# Patient Record
Sex: Male | Born: 1995 | Race: White | Hispanic: No | Marital: Single | Smoking: Former smoker
Health system: Southern US, Community
[De-identification: ages and names within clinical notes are randomized; demographics above are authoritative.]

## PROBLEM LIST (undated history)

## (undated) HISTORY — PX: TONSILLECTOMY: SUR1361

## (undated) HISTORY — PX: ADENOIDECTOMY: SUR15

## (undated) HISTORY — PX: TYMPANOSTOMY TUBE PLACEMENT: SHX32

## (undated) HISTORY — PX: OTHER SURGICAL HISTORY: SHX169

---
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
## 2013-01-24 ENCOUNTER — Emergency Department: Payer: Self-pay | Admitting: Emergency Medicine

## 2013-04-05 ENCOUNTER — Emergency Department: Payer: Self-pay | Admitting: Emergency Medicine

## 2013-09-15 ENCOUNTER — Emergency Department: Payer: Self-pay | Admitting: Internal Medicine

## 2013-09-18 LAB — BETA STREP CULTURE(ARMC)

## 2014-04-25 ENCOUNTER — Emergency Department: Payer: Self-pay | Admitting: Emergency Medicine

## 2014-05-09 ENCOUNTER — Emergency Department: Payer: Self-pay | Admitting: Emergency Medicine

## 2014-10-11 ENCOUNTER — Emergency Department: Payer: Self-pay | Admitting: Emergency Medicine

## 2017-05-07 ENCOUNTER — Encounter: Payer: Self-pay | Admitting: Emergency Medicine

## 2017-05-07 ENCOUNTER — Emergency Department: Payer: Self-pay

## 2017-05-07 ENCOUNTER — Emergency Department
Admission: EM | Admit: 2017-05-07 | Discharge: 2017-05-07 | Disposition: A | Discharge status: Home / Self Care | Payer: Self-pay | Attending: Emergency Medicine | Admitting: Emergency Medicine

## 2017-05-07 DIAGNOSIS — J069 Acute upper respiratory infection, unspecified: Secondary | ICD-10-CM | POA: Insufficient documentation

## 2017-05-07 DIAGNOSIS — F17210 Nicotine dependence, cigarettes, uncomplicated: Secondary | ICD-10-CM | POA: Insufficient documentation

## 2017-05-07 MED ORDER — FLUTICASONE PROPIONATE 50 MCG/ACT NA SUSP: 2.0000 | Freq: Every day | NASAL | 0 refills | Status: DC

## 2017-05-07 MED ORDER — DOXYCYCLINE HYCLATE 50 MG PO CAPS: 100.0000 mg | ORAL_CAPSULE | Freq: Two times a day (BID) | ORAL | 0 refills | Status: AC

## 2017-05-07 MED ORDER — ALBUTEROL SULFATE HFA 108 (90 BASE) MCG/ACT IN AERS: 2.0000 | INHALATION_SPRAY | Freq: Four times a day (QID) | RESPIRATORY_TRACT | 0 refills | Status: DC | PRN

## 2017-05-07 NOTE — ED Provider Notes (Signed)
[facility] Emergency Department Provider Note  ____________________________________________  Time seen: Approximately 9:10 AM  I have reviewed the triage vital signs and the nursing notes.   HISTORY  Chief Complaint URI    HPI Andre Graham is a 21 y.o. male that presents to the emergency department with congestion and productive cough for 7 days. He is coughing up brown sputum. He is eating and drinking normally. He smokes a pack of cigarettes a day. He has pneumonia previously. No history of allergies or asthma. No fevers, chills, SOB, nausea, vomiting, abdominal pain, diarrhea, constipation.    History reviewed. No pertinent past medical history.  There are no active problems to display for this patient.   Past Surgical History:  Procedure Laterality Date  . craniofacial     surgeries    Prior to Admission medications   Medication Sig Start Date End Date Taking? Authorizing Provider  albuterol (PROVENTIL HFA;VENTOLIN HFA) 108 (90 Base) MCG/ACT inhaler Inhale 2 puffs into the lungs every 6 (six) hours as needed for wheezing or shortness of breath. 05/07/17   Enid DerryWagner, Jeury Mcnab, PA-C  doxycycline (VIBRAMYCIN) 50 MG capsule Take 2 capsules (100 mg total) by mouth 2 (two) times daily. 05/07/17 05/17/17  Enid DerryWagner, Lucine Bilski, PA-C  fluticasone (FLONASE) 50 MCG/ACT nasal spray Place 2 sprays into both nostrils daily. 05/07/17 05/07/18  Enid DerryWagner, Jacelynn Hayton, PA-C    Allergies Patient has no known allergies.  No family history on file.  Social History Social History  Substance Use Topics  . Smoking status: Current Every Day Smoker    Types: Cigarettes  . Smokeless tobacco: Never Used  . Alcohol use Not on file     Review of Systems  Constitutional: No fever/chills Eyes: No visual changes. No discharge. ENT: Positive for congestion and rhinorrhea. Respiratory: Positive for cough. No SOB. Gastrointestinal: No abdominal pain.  No nausea, no vomiting.  No diarrhea.  No  constipation. Musculoskeletal: Negative for musculoskeletal pain. Skin: Negative for rash, abrasions, lacerations, ecchymosis. Neurological: Negative for headaches.   ____________________________________________   PHYSICAL EXAM:  VITAL SIGNS: ED Triage Vitals  Enc Vitals Group     BP 05/07/17 0825 116/78     Pulse Rate 05/07/17 0825 78     Resp 05/07/17 0825 18     Temp 05/07/17 0825 98.9 F (37.2 C)     Temp Source 05/07/17 0825 Oral     SpO2 05/07/17 0825 97 %     Weight 05/07/17 0819 245 lb (111.1 kg)     Height 05/07/17 0819 5\' 9"  (1.753 m)     Head Circumference --      Peak Flow --      Pain Score --      Pain Loc --      Pain Edu? --      Excl. in GC? --      Constitutional: Alert and oriented. Well appearing and in no acute distress. Eyes: Conjunctivae are normal. PERRL. EOMI. No discharge. Head: Atraumatic. ENT: No frontal and maxillary sinus tenderness.      Ears: Tympanic membranes pearly gray with good landmarks. No discharge.      Nose: Mild congestion/rhinnorhea.      Mouth/Throat: Mucous membranes are moist. Oropharynx non-erythematous. Tonsils not enlarged. No exudates. Uvula midline. Neck: No stridor.   Hematological/Lymphatic/Immunilogical: No cervical lymphadenopathy. Cardiovascular: Normal rate, regular rhythm.  Good peripheral circulation. Respiratory: Normal respiratory effort without tachypnea or retractions. Lungs CTAB. Good air entry to the bases with no decreased or  absent breath sounds. Gastrointestinal: Bowel sounds 4 quadrants. Soft and nontender to palpation. No guarding or rigidity. No palpable masses. No distention. Musculoskeletal: Full range of motion to all extremities. No gross deformities appreciated. Neurologic:  Normal speech and language. No gross focal neurologic deficits are appreciated.  Skin:  Skin is warm, dry and intact. No rash noted.   ____________________________________________   LABS (all labs ordered are listed,  but only abnormal results are displayed)  Labs Reviewed - No data to display ____________________________________________  EKG   ____________________________________________  RADIOLOGY Lexine BatonI, Micaiah Litle, personally viewed and evaluated these images (plain radiographs) as part of my medical decision making, as well as reviewing the written report by the radiologist.  Dg Chest 2 View  Result Date: 05/07/2017 CLINICAL DATA:  Productive cough for 7 days. EXAM: CHEST  2 VIEW COMPARISON:  None available FINDINGS: Normal heart size and mediastinal contours. No acute infiltrate or edema. No effusion or pneumothorax. No acute osseous findings. IMPRESSION: Negative chest. Electronically Signed   By: Marnee SpringJonathon  Watts M.D.   On: 05/07/2017 09:37    ____________________________________________    PROCEDURES  Procedure(s) performed:    Procedures    Medications - No data to display   ____________________________________________   INITIAL IMPRESSION / ASSESSMENT AND PLAN / ED COURSE  Pertinent labs & imaging results that were available during my care of the patient were reviewed by me and considered in my medical decision making (see chart for details).  Review of the [place] CSRS was performed in accordance of the [facility] prior to dispensing any controlled drugs.  Patient's diagnosis is consistent with upper respiratory infection. Vital signs and exam are reassuring. Chest x-ray negative for acute cardiopulmonary processes. Patient appears well and is staying well hydrated. Patient feels comfortable going home. Patient will be discharged home with prescriptions for doxycycline, albuterol, Flonase. Patient is to follow up with PCP as needed or otherwise directed. Patient is given ED precautions to return to the ED for any worsening or new symptoms.     ____________________________________________  FINAL CLINICAL IMPRESSION(S) / ED DIAGNOSES  Final diagnoses:  Upper respiratory tract  infection, unspecified type      NEW MEDICATIONS STARTED DURING THIS VISIT:  Discharge Medication List as of 05/07/2017 10:08 AM    START taking these medications   Details  albuterol (PROVENTIL HFA;VENTOLIN HFA) 108 (90 Base) MCG/ACT inhaler Inhale 2 puffs into the lungs every 6 (six) hours as needed for wheezing or shortness of breath., Starting Thu 05/07/2017, Print    doxycycline (VIBRAMYCIN) 50 MG capsule Take 2 capsules (100 mg total) by mouth 2 (two) times daily., Starting Thu 05/07/2017, Until Sun 05/17/2017, Print    fluticasone (FLONASE) 50 MCG/ACT nasal spray Place 2 sprays into both nostrils daily., Starting Thu 05/07/2017, Until Fri 05/07/2018, Print            This chart was dictated using voice recognition software/Dragon. Despite best efforts to proofread, errors can occur which can change the meaning. Any change was purely unintentional.    Enid DerryWagner, Rage Beever, PA-C 05/07/17 1040    Arnaldo NatalMalinda, Paul F, MD 05/07/17 [phone]

## 2017-05-07 NOTE — ED Notes (Signed)
Presents with several days of cough,which has been productive a times  States he is coughing up some dark phlegm  Unsure of fever but is afebrile on arrival  Also having some discomfort in chest with inspiration

## 2017-08-20 ENCOUNTER — Encounter: Payer: Self-pay | Admitting: Emergency Medicine

## 2017-08-20 ENCOUNTER — Other Ambulatory Visit: Payer: Self-pay

## 2017-08-20 ENCOUNTER — Emergency Department
Admission: EM | Admit: 2017-08-20 | Discharge: 2017-08-20 | Disposition: A | Discharge status: Home / Self Care | Payer: Self-pay | Attending: Emergency Medicine | Admitting: Emergency Medicine

## 2017-08-20 DIAGNOSIS — M5431 Sciatica, right side: Secondary | ICD-10-CM | POA: Insufficient documentation

## 2017-08-20 DIAGNOSIS — F17210 Nicotine dependence, cigarettes, uncomplicated: Secondary | ICD-10-CM | POA: Insufficient documentation

## 2017-08-20 LAB — GLUCOSE, CAPILLARY: GLUCOSE-CAPILLARY: 103 mg/dL — AB (ref 65–99)

## 2017-08-20 MED ORDER — LIDOCAINE 5 % EX PTCH
1.0000 | MEDICATED_PATCH | CUTANEOUS | Status: DC
  Administered 2017-08-20: 1 via TRANSDERMAL
  Filled 2017-08-20: qty 1

## 2017-08-20 MED ORDER — CYCLOBENZAPRINE HCL 10 MG PO TABS: 10.0000 mg | ORAL_TABLET | Freq: Three times a day (TID) | ORAL | 0 refills | Status: DC | PRN

## 2017-08-20 MED ORDER — CYCLOBENZAPRINE HCL 10 MG PO TABS
10.0000 mg | ORAL_TABLET | Freq: Once | ORAL | Status: AC
  Administered 2017-08-20: 10 mg via ORAL
  Filled 2017-08-20: qty 1

## 2017-08-20 MED ORDER — LIDOCAINE 5 % EX PTCH: 1.0000 | MEDICATED_PATCH | CUTANEOUS | 0 refills | Status: AC

## 2017-08-20 NOTE — ED Triage Notes (Signed)
Patient ambulatory to triage with steady gait, without difficulty or distress noted; pt c/o lower back pain radiating down right leg with no specific injury, hx of same

## 2017-08-20 NOTE — ED Notes (Signed)
Pt has low back pain radiating into right leg.  Sx for 1.5 months.  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  [place] dr in [place] dx with muscle pain.  Pt denies urinary sx.

## 2017-08-20 NOTE — ED Notes (Signed)
Pt was advised not to drive since he received a muscle relaxer. Pt states that someone is picking him up at the ED.

## 2017-08-20 NOTE — ED Provider Notes (Signed)
[facility] Emergency Department Provider Note   First MD Initiated Contact with Patient 08/20/17 [phone]     (approximate)  I have reviewed the triage vital signs and the nursing notes.   HISTORY  Chief Complaint Back Pain   HPI Andre Graham is a 21 y.o. male presents to the emergency department with 1-10/1739-month history of low back pain with radiation down the right posterior leg.  Patient states his current pain score 7 out of 10 worse with movement.  Patient denies any urinary or bowel habit changes.  Patient denies any lower extremity weakness.   Past medical history Back pain There are no active problems to display for this patient.   Past Surgical History:  Procedure Laterality Date  . ADENOIDECTOMY    . craniofacial     surgeries  . TONSILLECTOMY    . TYMPANOSTOMY TUBE PLACEMENT      Prior to Admission medications   Medication Sig Start Date End Date Taking? Authorizing Provider  albuterol (PROVENTIL HFA;VENTOLIN HFA) 108 (90 Base) MCG/ACT inhaler Inhale 2 puffs into the lungs every 6 (six) hours as needed for wheezing or shortness of breath. 05/07/17   Enid DerryWagner, Ashley, PA-C  fluticasone (FLONASE) 50 MCG/ACT nasal spray Place 2 sprays into both nostrils daily. 05/07/17 05/07/18  Enid DerryWagner, Ashley, PA-C    Allergies No known drug allergies No family history on file.  Social History Social History   Tobacco Use  . Smoking status: Current Every Day Smoker    Types: Cigarettes  . Smokeless tobacco: Never Used  Substance Use Topics  . Alcohol use: No    Frequency: Never  . Drug use: Not on file    Review of Systems Constitutional: No fever/chills Eyes: No visual changes. ENT: No sore throat. Cardiovascular: Denies chest pain. Respiratory: Denies shortness of breath. Gastrointestinal: No abdominal pain.  No nausea, no vomiting.  No diarrhea.  No constipation. Genitourinary: Negative for dysuria. Musculoskeletal: Negative for neck pain.   Positive for back pain. Integumentary: Negative for rash. Neurological: Negative for headaches, focal weakness or numbness.   ____________________________________________   PHYSICAL EXAM:  VITAL SIGNS: ED Triage Vitals  Enc Vitals Group     BP 08/20/17 0210 (!) 146/92     Pulse Rate 08/20/17 0210 91     Resp 08/20/17 0210 18     Temp 08/20/17 0210 97.6 F (36.4 C)     Temp Source 08/20/17 0210 Oral     SpO2 08/20/17 0210 99 %     Weight 08/20/17 0207 117.9 kg (260 lb)     Height 08/20/17 0207 1.753 m (5\' 9" )     Head Circumference --      Peak Flow --      Pain Score 08/20/17 0207 7     Pain Loc --      Pain Edu? --      Excl. in GC? --     Constitutional: Alert and oriented. Well appearing and in no acute distress. Eyes: Conjunctivae are normal. Head: Atraumatic. Mouth/Throat: Mucous membranes are moist.  Oropharynx non-erythematous. Neck: No stridor.   Cardiovascular: Normal rate, regular rhythm. Good peripheral circulation. Grossly normal heart sounds. Respiratory: Normal respiratory effort.  No retractions. Lungs CTAB. Gastrointestinal: Soft and nontender. No distention.  Musculoskeletal: No lower extremity tenderness nor edema. No gross deformities of extremities.  Pain with right paraspinal muscle palpation Neurologic:  Normal speech and language. No gross focal neurologic deficits are appreciated.  Skin:  Skin is warm,  dry and intact. No rash noted. Psychiatric: Mood and affect are normal. Speech and behavior are normal.  ____________________________________________   LABS (all labs ordered are listed, but only abnormal results are displayed)  Labs Reviewed  GLUCOSE, CAPILLARY - Abnormal; Notable for the following components:      Result Value   Glucose-Capillary 103 (*)    All other components within normal limits      Procedures   ____________________________________________   INITIAL IMPRESSION / ASSESSMENT AND PLAN / ED COURSE  As part of my  medical decision making, I reviewed the following data within the electronic MEDICAL RECORD 79 year old male presented with above-stated history and physical exam consistent with sciatica.  Consider the possibility of cauda equina syndrome however I suspect this to be unlikely given no supporting symptoms for that diagnosis.  Patient will be referred to Dr. [place]cell [place] neurosurgeon on call for further outpatient evaluation and management.    ____________________________________________  FINAL CLINICAL IMPRESSION(S) / ED DIAGNOSES  Final diagnoses:  Sciatica of right side     MEDICATIONS GIVEN DURING THIS VISIT:  Medications  lidocaine (LIDODERM) 5 % 1 patch (1 patch Transdermal Patch Applied 08/20/17 0310)  cyclobenzaprine (FLEXERIL) tablet 10 mg (10 mg Oral Given 08/20/17 0309)     ED Discharge Orders    None       Note:  This document was prepared using Dragon voice recognition software and may include unintentional dictation errors.    Darci CurrentBrown, Dunn N, MD 08/20/17 [phone]

## 2018-08-02 ENCOUNTER — Other Ambulatory Visit: Payer: Self-pay

## 2018-08-02 ENCOUNTER — Encounter: Payer: Self-pay | Admitting: Emergency Medicine

## 2018-08-02 ENCOUNTER — Emergency Department
Admission: EM | Admit: 2018-08-02 | Discharge: 2018-08-02 | Disposition: A | Discharge status: Home / Self Care | Payer: Self-pay | Attending: Emergency Medicine | Admitting: Emergency Medicine

## 2018-08-02 DIAGNOSIS — X509XXA Other and unspecified overexertion or strenuous movements or postures, initial encounter: Secondary | ICD-10-CM | POA: Insufficient documentation

## 2018-08-02 DIAGNOSIS — Y929 Unspecified place or not applicable: Secondary | ICD-10-CM | POA: Insufficient documentation

## 2018-08-02 DIAGNOSIS — Y9389 Activity, other specified: Secondary | ICD-10-CM | POA: Insufficient documentation

## 2018-08-02 DIAGNOSIS — Y998 Other external cause status: Secondary | ICD-10-CM | POA: Insufficient documentation

## 2018-08-02 DIAGNOSIS — F17210 Nicotine dependence, cigarettes, uncomplicated: Secondary | ICD-10-CM | POA: Insufficient documentation

## 2018-08-02 DIAGNOSIS — S39012A Strain of muscle, fascia and tendon of lower back, initial encounter: Secondary | ICD-10-CM | POA: Insufficient documentation

## 2018-08-02 MED ORDER — IBUPROFEN 600 MG PO TABS: 600.0000 mg | ORAL_TABLET | Freq: Four times a day (QID) | ORAL | 0 refills | Status: DC | PRN

## 2018-08-02 MED ORDER — CYCLOBENZAPRINE HCL 5 MG PO TABS: ORAL_TABLET | ORAL | 0 refills | Status: DC

## 2018-08-02 MED ORDER — LIDOCAINE 5 % EX PTCH: 1.0000 | MEDICATED_PATCH | CUTANEOUS | 0 refills | Status: DC

## 2018-08-02 NOTE — ED Provider Notes (Signed)
[facility] Emergency Department Provider Note  ____________________________________________  Time seen: Approximately 8:40 AM  I have reviewed the triage vital signs and the nursing notes.   HISTORY  Chief Complaint Back Pain    HPI Andre Graham is a 22 y.o. male that presents emergency department for evaluation of low left-sided back pain for 2 days.  Patient works changing oils of cars.  He does not do a lot of lifting but he does a lot of bending and ups and downs.  He has to jump into a 3 foot hole every day.  No recent fall or trauma.  Pain started after doing a lot of bending yesterday.  He has had sciatica before.  He took Tylenol yesterday for pain.  No nausea, vomiting, abdominal pain, radicular pain, bowel or bladder symptoms, or saddle anesthesias.  History reviewed. No pertinent past medical history.  There are no active problems to display for this patient.   Past Surgical History:  Procedure Laterality Date  . ADENOIDECTOMY    . craniofacial     surgeries  . TONSILLECTOMY    . TYMPANOSTOMY TUBE PLACEMENT      Prior to Admission medications   Medication Sig Start Date End Date Taking? Authorizing Provider  albuterol (PROVENTIL HFA;VENTOLIN HFA) 108 (90 Base) MCG/ACT inhaler Inhale 2 puffs into the lungs every 6 (six) hours as needed for wheezing or shortness of breath. 05/07/17   Enid Derry, PA-C  cyclobenzaprine (FLEXERIL) 5 MG tablet Take 1-2 tablets 3 times daily as needed 08/02/18   Enid Derry, PA-C  fluticasone [facility]) 50 MCG/ACT nasal spray Place 2 sprays into both nostrils daily. 05/07/17 05/07/18  Enid Derry, PA-C  ibuprofen (ADVIL,MOTRIN) 600 MG tablet Take 1 tablet (600 mg total) by mouth every 6 (six) hours as needed. 08/02/18   Enid Derry, PA-C  lidocaine (LIDODERM) 5 % Place 1 patch onto the skin daily. Remove & Discard patch within 12 hours or as directed by MD 08/02/18   Enid Derry, PA-C     Allergies Patient has no known allergies.  No family history on file.  Social History Social History   Tobacco Use  . Smoking status: Current Every Day Smoker    Types: Cigarettes  . Smokeless tobacco: Never Used  Substance Use Topics  . Alcohol use: No    Frequency: Never  . Drug use: Not on file     Review of Systems  Constitutional: No fever/chills Cardiovascular: No chest pain. Respiratory: No SOB. Gastrointestinal: No abdominal pain.  No nausea, no vomiting.  Musculoskeletal: Positive for back pain.  Skin: Negative for rash, abrasions, lacerations, ecchymosis. Neurological: Negative for headaches, numbness or tingling   ____________________________________________   PHYSICAL EXAM:  VITAL SIGNS: ED Triage Vitals  Enc Vitals Group     BP 08/02/18 0803 139/80     Pulse Rate 08/02/18 0803 73     Resp --      Temp 08/02/18 0803 98 F (36.7 C)     Temp Source 08/02/18 0803 Oral     SpO2 08/02/18 0803 99 %     Weight 08/02/18 0804 250 lb (113.4 kg)     Height 08/02/18 0804 5\' 10"  (1.778 m)     Head Circumference --      Peak Flow --      Pain Score 08/02/18 0809 7     Pain Loc --      Pain Edu? --      Excl. in GC? --  Constitutional: Alert and oriented. Well appearing and in no acute distress. Eyes: Conjunctivae are normal. PERRL. EOMI. Head: Atraumatic. ENT:      Ears:      Nose: No congestion/rhinnorhea.      Mouth/Throat: Mucous membranes are moist.  Neck: No stridor. Cardiovascular: Normal rate, regular rhythm.  Good peripheral circulation. Respiratory: Normal respiratory effort without tachypnea or retractions. Lungs CTAB. Good air entry to the bases with no decreased or absent breath sounds. Gastrointestinal: Bowel sounds 4 quadrants. Soft and nontender to palpation. No guarding or rigidity. No palpable masses. No distention. No CVA tenderness. Musculoskeletal: Full range of motion to all extremities. No gross deformities appreciated.   Tenderness to palpation over left lumbar paraspinal muscles.  No tenderness palpation of her lumbar spine.  Strength equal in lower extremity's bilaterally. Neurologic:  Normal speech and language. No gross focal neurologic deficits are appreciated.  Skin:  Skin is warm, dry and intact. No rash noted. Psychiatric: Mood and affect are normal. Speech and behavior are normal. Patient exhibits appropriate insight and judgement.   ____________________________________________   LABS (all labs ordered are listed, but only abnormal results are displayed)  Labs Reviewed - No data to display ____________________________________________  EKG   ____________________________________________  RADIOLOGY   No results found.  ____________________________________________    PROCEDURES  Procedure(s) performed:    Procedures    Medications - No data to display   ____________________________________________   INITIAL IMPRESSION / ASSESSMENT AND PLAN / ED COURSE  Pertinent labs & imaging results that were available during my care of the patient were reviewed by me and considered in my medical decision making (see chart for details).  Review of the [place] CSRS was performed in accordance of the [facility] prior to dispensing any controlled drugs.   Patient's diagnosis is consistent with muscle strain.  Patient declines IM Toradol.  Lidoderm patch was placed.  Patient will be discharged home with prescriptions for ibuprofen and Flexeril. Patient is to follow up with primary care as directed. Patient is given ED precautions to return to the ED for any worsening or new symptoms.     ____________________________________________  FINAL CLINICAL IMPRESSION(S) / ED DIAGNOSES  Final diagnoses:  Strain of lumbar region, initial encounter      NEW MEDICATIONS STARTED DURING THIS VISIT:  ED Discharge Orders         Ordered    cyclobenzaprine (FLEXERIL) 5 MG tablet     08/02/18 0847     ibuprofen (ADVIL,MOTRIN) 600 MG tablet  Every 6 hours PRN     08/02/18 0847    lidocaine (LIDODERM) 5 %  Every 24 hours     08/02/18 0847              This chart was dictated using voice recognition software/Dragon. Despite best efforts to proofread, errors can occur which can change the meaning. Any change was purely unintentional.    Enid Derry, PA-C 08/02/18 1604    Emily Filbert, MD 08/03/18 [phone]

## 2018-08-02 NOTE — ED Triage Notes (Signed)
Presents with lower back pain which started yesterday  Unsure of injury  States pain is non radiating   Ambulates well to treatment room

## 2020-03-02 ENCOUNTER — Encounter: Payer: Self-pay | Admitting: Emergency Medicine

## 2020-03-02 ENCOUNTER — Other Ambulatory Visit: Payer: Self-pay

## 2020-03-02 ENCOUNTER — Emergency Department
Admission: EM | Admit: 2020-03-02 | Discharge: 2020-03-02 | Disposition: A | Discharge status: Home / Self Care | Payer: Self-pay | Attending: Emergency Medicine | Admitting: Emergency Medicine

## 2020-03-02 DIAGNOSIS — Z5321 Procedure and treatment not carried out due to patient leaving prior to being seen by health care provider: Secondary | ICD-10-CM | POA: Insufficient documentation

## 2020-03-02 DIAGNOSIS — R112 Nausea with vomiting, unspecified: Secondary | ICD-10-CM | POA: Insufficient documentation

## 2020-03-02 DIAGNOSIS — R1011 Right upper quadrant pain: Secondary | ICD-10-CM | POA: Insufficient documentation

## 2020-03-02 LAB — URINALYSIS, COMPLETE (UACMP) WITH MICROSCOPIC
Bacteria, UA: NONE SEEN
Bilirubin Urine: NEGATIVE
Glucose, UA: NEGATIVE mg/dL
Hgb urine dipstick: NEGATIVE
Ketones, ur: NEGATIVE mg/dL
Leukocytes,Ua: NEGATIVE
Nitrite: NEGATIVE
Protein, ur: NEGATIVE mg/dL
Specific Gravity, Urine: 1.027 (ref 1.005–1.030)
pH: 6 (ref 5.0–8.0)

## 2020-03-02 NOTE — ED Notes (Signed)
Pt refused blood draw

## 2020-03-02 NOTE — ED Triage Notes (Signed)
Pt reports abd cramping to right upper and left lower and some NVD for the last few days.

## 2021-03-02 ENCOUNTER — Encounter: Payer: Self-pay | Admitting: Intensive Care

## 2021-03-02 ENCOUNTER — Emergency Department
Admission: EM | Admit: 2021-03-02 | Discharge: 2021-03-02 | Disposition: A | Discharge status: Home / Self Care | Payer: Self-pay | Attending: Emergency Medicine | Admitting: Emergency Medicine

## 2021-03-02 ENCOUNTER — Other Ambulatory Visit: Payer: Self-pay

## 2021-03-02 DIAGNOSIS — F17210 Nicotine dependence, cigarettes, uncomplicated: Secondary | ICD-10-CM | POA: Insufficient documentation

## 2021-03-02 DIAGNOSIS — K029 Dental caries, unspecified: Secondary | ICD-10-CM | POA: Insufficient documentation

## 2021-03-02 MED ORDER — TRAMADOL HCL 50 MG PO TABS: 50.0000 mg | ORAL_TABLET | Freq: Four times a day (QID) | ORAL | 0 refills | Status: DC | PRN

## 2021-03-02 MED ORDER — AMOXICILLIN 875 MG PO TABS: 875.0000 mg | ORAL_TABLET | Freq: Two times a day (BID) | ORAL | 0 refills | Status: DC

## 2021-03-02 NOTE — Discharge Instructions (Addendum)
OPTIONS FOR DENTAL FOLLOW UP CARE ° °[place] Department of Health and Human Services - Local Safety Net Dental Clinics °http://www.ncdhhs.gov/dph/oralhealth/services/safetynetclinics.htm °  °[facility] (336-562-3123) ° °[facility] [place] (919-933-9087) ° °[facility] (919-663-1744 ext [phone]) ° °[facility] ([phone]) ° °[facility] ([phone]) °This clinic caters to the indigent population and is on a lottery system. °Location: °[facility], [address], [place] °Clinic Hours: °Wednesdays from 6pm - 9pm, patients seen by a lottery system. °For dates, call or go to [email or web address].[facility].edu/[facility]/patients/Dental-[facility] °Services: °Cleanings, fillings and simple extractions. °Payment Options: °DENTAL WORK IS FREE OF CHARGE. Bring proof of income or support. °Best way to get seen: °Arrive at 5:15 pm - this is a lottery, NOT first come/first serve, so arriving earlier will not increase your chances of being seen. °  °  °[facility] °[phone] °Select option 1 for emergencies °  °Location: °[facility], [address], [place] °Clinic Hours: °No walk-ins accepted - call the day before to schedule an appointment. °Check in times are 9:30 am and 1:30 pm. °Services: °Simple extractions, temporary fillings, pulpectomy/pulp debridement, uncomplicated abscess drainage. °Payment Options: °PAYMENT IS DUE AT THE TIME OF SERVICE.  Fee is usually $100-200, additional surgical procedures (e.g. abscess drainage) may be extra. °Cash, checks, Visa/MasterCard accepted.  Can file Medicaid if patient is covered for dental - patient should call case worker to check. °No discount for [facility] patients. °Best way to get seen: °MUST call the day before and get onto the schedule. Can usually be seen the next 1-2 days. No walk-ins accepted. °  °  °[facility] °[phone] °   °Location: °[facility], [address], [place] °Clinic Hours: °M, W, Th, F 8am or 1:30pm, Tues 9a or 1:30 - first come/first served. °Services: °Simple extractions, temporary fillings, uncomplicated abscess drainage.  You do not need to be an [place] resident. °Payment Options: °PAYMENT IS DUE AT THE TIME OF SERVICE. °Dental insurance, otherwise sliding scale - bring proof of income or support. °Depending on income and treatment needed, cost is usually $50-200. °Best way to get seen: °Arrive early as it is first come/first served. °  °  °[facility] °[phone] °  °Location: °[address] °Clinic Hours: °Mon-Thu 8a-5p °Services: °Most basic dental services including extractions and fillings. °Payment Options: °PAYMENT IS DUE AT THE TIME OF SERVICE. °Sliding scale, up to 50% off - bring proof if income or support. °Medicaid with dental option accepted. °Best way to get seen: °Call to schedule an appointment, can usually be seen within 2 weeks OR they will try to see walk-ins - show up at 8a or 2p (you may have to wait). °  °  °[facility] °[phone] °[place] RESIDENTS ONLY °  °Location: °[facility], [address], [place], [place] [address] °Clinic Hours: By appointment only. °Monday - Thursday 8am-5pm, Friday 8am-12pm °Services: Cleanings, fillings, extractions. °Payment Options: °PAYMENT IS DUE AT THE TIME OF SERVICE. °Cash, Visa or MasterCard. Sliding scale - $30 minimum per service. °Best way to get seen: °Come in to office, complete packet and make an appointment - need proof of income °or support monies for each household member and proof of [place] residence. °Usually takes about a month to get in. °  °  °[facility] °[phone] °  °Location: °[address]   [place] °Clinic Hours: Walk-in Urgent Care Dental Services are offered Monday-Friday  mornings only. °The numbers of emergencies accepted daily is limited to the number of °providers available. °Maximum 15 - Mondays, Wednesdays & Thursdays °Maximum 10 - Tuesdays & Fridays °Services: °You do not need to be a [place] resident to be seen for a dental emergency. °Emergencies are defined as pain, swelling, abnormal bleeding, or dental trauma. Walkins will receive x-rays if needed. °NOTE: Dental cleaning is not an emergency. °Payment Options: °PAYMENT IS DUE AT THE TIME OF SERVICE. °Minimum co-pay is $40.00 for uninsured patients. °Minimum co-pay is $3.00 for Medicaid with dental coverage. °Dental Insurance is accepted and must be presented at time of visit. °Medicare does not cover dental. °Forms of payment: Cash, credit card, checks. °Best way to get seen: °If not previously registered with the clinic, walk-in dental registration begins at 7:15 am and is on a first come/first serve basis. °If previously registered with the clinic, call to make an appointment. °  °  °[facility] °[phone] °[place] RESIDENTS ONLY °  °Location: °[address], [place], [place] °Clinic Hours: °Mon-Thu 10a-2p °Services: Extractions only! °Payment Options: °FREE (donations accepted) - bring proof of income or support °Best way to get seen: °Call and schedule an appointment OR come at 8am on the 1st Monday of every month (except for holidays) when it is first come/first served. °  °  °[facility] °[phone] °  °Location: °[address], [place] °Clinic Hours: °Friday mornings °Services, Payment Options, Best way to get seen: °Call for info °

## 2021-03-02 NOTE — ED Triage Notes (Signed)
Patient presents with dental pain

## 2021-03-02 NOTE — ED Provider Notes (Signed)
[facility] Emergency Department Provider Note  ____________________________________________   Event Date/Time   First MD Initiated Contact with Patient 03/02/21 [phone]     (approximate)  I have reviewed the triage vital signs and the nursing notes.   HISTORY  Chief Complaint Dental Pain    HPI UMAR PATMON is a 25 y.o. male presents emergency department complaining of right-sided lower jaw pain from dental caries.  See HPI.  Patient states he might of had a fever and chills.  She has taken an antibiotic given to him by his aunt and is unsure of which one it is.  States it is a blue and purple capsule.  Patient has not seen a regular dentist in many years.    History reviewed. No pertinent past medical history.  There are no problems to display for this patient.   Past Surgical History:  Procedure Laterality Date  . ADENOIDECTOMY    . craniofacial     surgeries  . TONSILLECTOMY    . TYMPANOSTOMY TUBE PLACEMENT      Prior to Admission medications   Medication Sig Start Date End Date Taking? Authorizing Provider  traMADol (ULTRAM) 50 MG tablet Take 1 tablet (50 mg total) by mouth every 6 (six) hours as needed. 03/02/21  Yes Lorita Forinash, Roselyn Bering, PA-C  albuterol (PROVENTIL HFA;VENTOLIN HFA) 108 (90 Base) MCG/ACT inhaler Inhale 2 puffs into the lungs every 6 (six) hours as needed for wheezing or shortness of breath. 05/07/17   Enid Derry, PA-C  amoxicillin (AMOXIL) 875 MG tablet Take 1 tablet (875 mg total) by mouth 2 (two) times daily. 03/02/21   Woodroe Vogan, Roselyn Bering, PA-C  fluticasone (FLONASE) 50 MCG/ACT nasal spray Place 2 sprays into both nostrils daily. 05/07/17 05/07/18  Enid Derry, PA-C    Allergies Cinnamon  History reviewed. No pertinent family history.  Social History Social History   Tobacco Use  . Smoking status: Current Every Day Smoker    Types: Cigarettes  . Smokeless tobacco: Never Used  Substance Use Topics  . Alcohol use: No  .  Drug use: Yes    Types: Marijuana    Review of Systems  Constitutional: No fever/chills Eyes: No visual changes. ENT: No sore throat.  Positive dental pain Respiratory: Denies cough Cardiovascular: Denies chest pain Gastrointestinal: Denies abdominal pain Genitourinary: Negative for dysuria. Musculoskeletal: Negative for back pain. Skin: Negative for rash. Psychiatric: no mood changes,     ____________________________________________   PHYSICAL EXAM:  VITAL SIGNS: ED Triage Vitals  Enc Vitals Group     BP 03/02/21 0724 126/78     Pulse Rate 03/02/21 0724 83     Resp 03/02/21 0724 16     Temp 03/02/21 0724 98.3 F (36.8 C)     Temp Source 03/02/21 0724 Oral     SpO2 03/02/21 0724 100 %     Weight 03/02/21 0725 260 lb (117.9 kg)     Height 03/02/21 0725 5\' 9"  (1.753 m)     Head Circumference --      Peak Flow --      Pain Score 03/02/21 0725 7     Pain Loc --      Pain Edu? --      Excl. in GC? --     Constitutional: Alert and oriented. Well appearing and in no acute distress. Eyes: Conjunctivae are normal.  Head: Atraumatic. Nose: No congestion/rhinnorhea. Mouth/Throat: Mucous membranes are moist.  Poor dentition noted, severe dental caries noted on the  right lower molars Neck:  supple no lymphadenopathy noted Cardiovascular: Normal rate, regular rhythm. Heart sounds are normal Respiratory: Normal respiratory effort.  No retractions, lungs c t a  GU: deferred Musculoskeletal: FROM all extremities, warm and well perfused Neurologic:  Normal speech and language.  Skin:  Skin is warm, dry and intact. No rash noted. Psychiatric: Mood and affect are normal. Speech and behavior are normal.  ____________________________________________   LABS (all labs ordered are listed, but only abnormal results are displayed)  Labs Reviewed - No data to  display ____________________________________________   ____________________________________________  RADIOLOGY    ____________________________________________   PROCEDURES  Procedure(s) performed: No  Procedures    ____________________________________________   INITIAL IMPRESSION / ASSESSMENT AND PLAN / ED COURSE  Pertinent labs & imaging results that were available during my care of the patient were reviewed by me and considered in my medical decision making (see chart for details).   Patient is a 25 year old male presents with dental pain.  See HPI.  Physical exam shows patient appears stable.  I did explain findings to the patient.  He is to follow-up with one of the dental clinics listed on his discharge papers.  Take antibiotic and pain medication as needed.  Continue take ibuprofen as needed.  Return emergency department worsening.  She is discharged stable condition.     AC COLAN was evaluated in Emergency Department on 03/02/2021 for the symptoms described in the history of present illness. He was evaluated in the context of the global COVID-19 pandemic, which necessitated consideration that the patient might be at risk for infection with the SARS-CoV-2 virus that causes COVID-19. Institutional protocols and algorithms that pertain to the evaluation of patients at risk for COVID-19 are in a state of rapid change based on information released by regulatory bodies including the CDC and federal and state organizations. These policies and algorithms were followed during the patient's care in the ED.    As part of my medical decision making, I reviewed the following data within the electronic MEDICAL RECORD NUMBER Nursing notes reviewed and incorporated, Old chart reviewed, Notes from prior ED visits and [place] Controlled Substance Database  ____________________________________________   FINAL CLINICAL IMPRESSION(S) / ED DIAGNOSES  Final diagnoses:  Pain due to dental  caries      NEW MEDICATIONS STARTED DURING THIS VISIT:  New Prescriptions   AMOXICILLIN (AMOXIL) 875 MG TABLET    Take 1 tablet (875 mg total) by mouth 2 (two) times daily.   TRAMADOL (ULTRAM) 50 MG TABLET    Take 1 tablet (50 mg total) by mouth every 6 (six) hours as needed.     Note:  This document was prepared using Dragon voice recognition software and may include unintentional dictation errors.    Faythe Ghee, PA-C 03/02/21 0759    Jene Every, MD 03/02/21 [phone]

## 2021-05-03 ENCOUNTER — Emergency Department
Admission: EM | Admit: 2021-05-03 | Discharge: 2021-05-03 | Disposition: A | Discharge status: Home / Self Care | Payer: 59 | Attending: Emergency Medicine | Admitting: Emergency Medicine

## 2021-05-03 ENCOUNTER — Other Ambulatory Visit: Payer: Self-pay

## 2021-05-03 ENCOUNTER — Encounter: Payer: Self-pay | Admitting: Physician Assistant

## 2021-05-03 ENCOUNTER — Emergency Department: Payer: 59

## 2021-05-03 DIAGNOSIS — F17210 Nicotine dependence, cigarettes, uncomplicated: Secondary | ICD-10-CM | POA: Insufficient documentation

## 2021-05-03 DIAGNOSIS — W228XXA Striking against or struck by other objects, initial encounter: Secondary | ICD-10-CM | POA: Insufficient documentation

## 2021-05-03 DIAGNOSIS — Y990 Civilian activity done for income or pay: Secondary | ICD-10-CM | POA: Insufficient documentation

## 2021-05-03 DIAGNOSIS — S6992XA Unspecified injury of left wrist, hand and finger(s), initial encounter: Secondary | ICD-10-CM | POA: Diagnosis present

## 2021-05-03 DIAGNOSIS — S60222A Contusion of left hand, initial encounter: Secondary | ICD-10-CM | POA: Insufficient documentation

## 2021-05-03 DIAGNOSIS — S63502A Unspecified sprain of left wrist, initial encounter: Secondary | ICD-10-CM | POA: Insufficient documentation

## 2021-05-03 DIAGNOSIS — Y9389 Activity, other specified: Secondary | ICD-10-CM | POA: Insufficient documentation

## 2021-05-03 NOTE — Discharge Instructions (Addendum)
Your exam and XR are consistent with a hand contusion and wrist sprain. There is no evidence of a fracture. Wear the wrist brace as needed. Follow-up with Ortho for ongoing symptoms. Apply ice to reduce pain and swelling. Take OTC Tylenol and Ibuprofen for pain.
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
## 2021-08-28 ENCOUNTER — Encounter: Payer: Self-pay | Admitting: Emergency Medicine

## 2021-08-28 ENCOUNTER — Emergency Department: Payer: 59

## 2021-08-28 ENCOUNTER — Other Ambulatory Visit: Payer: Self-pay

## 2021-08-28 ENCOUNTER — Emergency Department
Admission: EM | Admit: 2021-08-28 | Discharge: 2021-08-28 | Disposition: A | Discharge status: Home / Self Care | Payer: 59 | Attending: Emergency Medicine | Admitting: Emergency Medicine

## 2021-08-28 DIAGNOSIS — W228XXA Striking against or struck by other objects, initial encounter: Secondary | ICD-10-CM | POA: Diagnosis not present

## 2021-08-28 DIAGNOSIS — Y990 Civilian activity done for income or pay: Secondary | ICD-10-CM | POA: Diagnosis not present

## 2021-08-28 DIAGNOSIS — F17210 Nicotine dependence, cigarettes, uncomplicated: Secondary | ICD-10-CM | POA: Diagnosis not present

## 2021-08-28 DIAGNOSIS — S5001XA Contusion of right elbow, initial encounter: Secondary | ICD-10-CM | POA: Insufficient documentation

## 2021-08-28 DIAGNOSIS — S161XXA Strain of muscle, fascia and tendon at neck level, initial encounter: Secondary | ICD-10-CM | POA: Insufficient documentation

## 2021-08-28 DIAGNOSIS — S199XXA Unspecified injury of neck, initial encounter: Secondary | ICD-10-CM | POA: Diagnosis present

## 2021-08-28 DIAGNOSIS — S0003XA Contusion of scalp, initial encounter: Secondary | ICD-10-CM | POA: Insufficient documentation
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
## 2021-08-28 NOTE — Discharge Instructions (Signed)
Follow-up with your primary care provider or your companies doctor if any continued problems.  Over the holiday weekend if you develop any worsening of your symptoms return to the emergency department.  You may take Tylenol or ibuprofen as needed for discomfort.  You may also use ice or heat to your muscles around her neck and scalp to reduce some of your pain.
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
## 2021-08-31 ENCOUNTER — Other Ambulatory Visit: Payer: Self-pay

## 2021-08-31 ENCOUNTER — Emergency Department: Payer: 59

## 2021-08-31 ENCOUNTER — Emergency Department
Admission: EM | Admit: 2021-08-31 | Discharge: 2021-08-31 | Disposition: A | Discharge status: Home / Self Care | Payer: 59 | Attending: Emergency Medicine | Admitting: Emergency Medicine

## 2021-08-31 ENCOUNTER — Encounter: Payer: Self-pay | Admitting: Emergency Medicine

## 2021-08-31 DIAGNOSIS — F17210 Nicotine dependence, cigarettes, uncomplicated: Secondary | ICD-10-CM | POA: Diagnosis not present

## 2021-08-31 DIAGNOSIS — S069X9A Unspecified intracranial injury with loss of consciousness of unspecified duration, initial encounter: Secondary | ICD-10-CM | POA: Diagnosis present

## 2021-08-31 DIAGNOSIS — Y990 Civilian activity done for income or pay: Secondary | ICD-10-CM | POA: Diagnosis not present

## 2021-08-31 DIAGNOSIS — S20211A Contusion of right front wall of thorax, initial encounter: Secondary | ICD-10-CM | POA: Insufficient documentation

## 2021-08-31 DIAGNOSIS — S060X9A Concussion with loss of consciousness of unspecified duration, initial encounter: Secondary | ICD-10-CM | POA: Insufficient documentation

## 2021-08-31 DIAGNOSIS — S20211D Contusion of right front wall of thorax, subsequent encounter: Secondary | ICD-10-CM

## 2021-08-31 MED ORDER — ONDANSETRON 4 MG PO TBDP
4.0000 mg | ORAL_TABLET | Freq: Once | ORAL | Status: AC
  Administered 2021-08-31: 4 mg via ORAL
  Filled 2021-08-31: qty 1

## 2021-08-31 MED ORDER — IBUPROFEN 400 MG PO TABS
400.0000 mg | ORAL_TABLET | Freq: Once | ORAL | Status: AC
  Administered 2021-08-31: 400 mg via ORAL
  Filled 2021-08-31: qty 1

## 2021-08-31 MED ORDER — ONDANSETRON HCL 4 MG PO TABS: 4.0000 mg | ORAL_TABLET | Freq: Three times a day (TID) | ORAL | 0 refills | Status: AC | PRN

## 2021-08-31 MED ORDER — ACETAMINOPHEN 500 MG PO TABS
1000.0000 mg | ORAL_TABLET | Freq: Once | ORAL | Status: AC
  Administered 2021-08-31: 1000 mg via ORAL
  Filled 2021-08-31: qty 2
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
## 2022-01-27 ENCOUNTER — Other Ambulatory Visit: Payer: Self-pay

## 2022-01-27 ENCOUNTER — Emergency Department
Admission: EM | Admit: 2022-01-27 | Discharge: 2022-01-27 | Disposition: A | Discharge status: Home / Self Care | Payer: 59 | Attending: Emergency Medicine | Admitting: Emergency Medicine

## 2022-01-27 DIAGNOSIS — R1084 Generalized abdominal pain: Secondary | ICD-10-CM | POA: Insufficient documentation

## 2022-01-27 DIAGNOSIS — R112 Nausea with vomiting, unspecified: Secondary | ICD-10-CM | POA: Insufficient documentation

## 2022-01-27 DIAGNOSIS — R1013 Epigastric pain: Secondary | ICD-10-CM | POA: Insufficient documentation

## 2022-01-27 DIAGNOSIS — R197 Diarrhea, unspecified: Secondary | ICD-10-CM | POA: Insufficient documentation

## 2022-01-27 DIAGNOSIS — R109 Unspecified abdominal pain: Secondary | ICD-10-CM

## 2022-01-27 MED ORDER — ONDANSETRON 8 MG PO TBDP
8.0000 mg | ORAL_TABLET | Freq: Once | ORAL | Status: AC
  Administered 2022-01-27: 8 mg via ORAL
  Filled 2022-01-27: qty 1

## 2022-01-27 MED ORDER — LOPERAMIDE HCL 2 MG PO TABS: 2.0000 mg | ORAL_TABLET | Freq: Four times a day (QID) | ORAL | 0 refills | Status: AC | PRN

## 2022-01-27 MED ORDER — ONDANSETRON 4 MG PO TBDP: 4.0000 mg | ORAL_TABLET | Freq: Three times a day (TID) | ORAL | 0 refills | Status: AC | PRN

## 2022-01-27 MED ORDER — DICYCLOMINE HCL 10 MG PO CAPS: 10.0000 mg | ORAL_CAPSULE | Freq: Three times a day (TID) | ORAL | 0 refills | Status: AC

## 2022-01-27 MED ORDER — DICYCLOMINE HCL 10 MG PO CAPS
10.0000 mg | ORAL_CAPSULE | Freq: Once | ORAL | Status: AC
  Administered 2022-01-27: 10 mg via ORAL
  Filled 2022-01-27: qty 1

## 2022-01-27 NOTE — ED Provider Notes (Signed)
? ?[facility] ?Provider Note ? ?Patient Contact: 9:10 PM (approximate) ? ? ?History  ? ?Abdominal Cramping ? ? ?HPI ? ?Andre Graham is a 26 y.o. male who presents to the emergency department complaining abdominal cramping, nausea and vomiting and diarrhea.  Patient states that he had coffee earlier today without a deep milk and had curled/chunks of milk at the bottom of his cup.  Patient states that he started develop some nausea, vomiting, abdominal cramping and diarrhea consistent with foodborne illness.  Patient has had no fevers, chills.  He is here seeking medications for symptom relief.  We discussed at his initial onset differential as well as planned work-up to include labs, imaging.  Patient did not want to pursue imaging or labs at this time.  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  I have cautioned him that while I do suspect that this is likely food poisoning I cannot rule out other etiologies of patient's symptoms.  He verbalizes understanding of same but states that he is here only for medications. ?  ? ? ?Physical Exam  ? ?Triage Vital Signs: ?ED Triage Vitals  ?Enc Vitals Group  ?   BP 01/27/22 1921 129/88  ?   Pulse Rate 01/27/22 1921 77  ?   Resp 01/27/22 1921 19  ?   Temp 01/27/22 1921 98.3 ?F (36.8 ?C)  ?   Temp Source 01/27/22 1921 Oral  ?   SpO2 01/27/22 1921 97 %  ?   Weight 01/27/22 1922 200 lb (90.7 kg)  ?   Height 01/27/22 1922 5\' 9"  (1.753 m)  ?   Head Circumference --   ?   Peak Flow --   ?   Pain Score 01/27/22 1921 6  ?   Pain Loc --   ?   Pain Edu? --   ?   Excl. in GC? --   ? ? ?Most recent vital signs: ?Vitals:  ? 01/27/22 1921  ?BP: 129/88  ?Pulse: 77  ?Resp: 19  ?Temp: 98.3 ?F (36.8 ?C)  ?SpO2: 97%  ? ? ? ?General: Alert and in no acute distress.  ?Cardiovascular:  Good peripheral perfusion ?Respiratory: Normal respiratory effort without tachypnea or retractions. Lungs CTAB ?Gastrointestinal:  Bowel sounds ?4 quadrants.  Soft to palpation all quadrants.  Slight tenderness in the epigastric region with mild diffuse abdominal tenderness to palpation.  No point specific tenderness.  No guarding or rigidity. No palpable masses. No distention. No CVA tenderness ?Musculoskeletal: Full range of motion to all extremities.  ?Neurologic:  No gross focal neurologic deficits are appreciated.  ?Skin:   No rash noted ?Other: ? ? ?ED Results / Procedures / Treatments  ? ?Labs ?(all labs ordered are listed, but only abnormal results are displayed) ?Labs Reviewed - No data to display ? ? ?EKG ? ? ? ? ?RADIOLOGY ? ? ? ?No results found. ? ?PROCEDURES: ? ?Critical Care performed: No ? ?Procedures ? ? ?MEDICATIONS ORDERED IN ED: ?Medications  ?ondansetron (ZOFRAN-ODT) disintegrating tablet 8 mg (has no administration in time range)  ?dicyclomine (BENTYL) capsule 10 mg (has no administration in time range)  ? ? ? ?IMPRESSION / MDM / ASSESSMENT AND PLAN / ED COURSE  ?I reviewed the triage vital signs and the nursing notes. ?             ?               ? ?  Differential diagnosis includes, but is not limited to, food poisoning, gastritis, GERD, cholecystitis, appendicitis, colitis, irritable bowel syndrome, Crohn's ? ? ?Patient's diagnosis is consistent with nausea, vomiting, diarrhea.  Patient presents to the ED after having some bad milk earlier today.  Milk was out of date, and curdled.  Patient is now experiencing abdominal cramping, nausea, vomiting, diarrhea.  Patient has a phobia of needles, declines any labs, imaging to include CT scan at this time.  Patient had some mild diffuse abdominal tenderness without point specific tenderness.  No fevers or chills are reported.  Vital signs are reassuring.  I will prescribe the patient symptom control medicine as this might lead differential is food poisoning though I have cautioned the patient at length that I cannot fully rule out other etiologies of his symptoms without labs  and imaging.  Patient verbalizes understanding of same.  If he cannot keep foods, liquids down, is having worsening pain, any fevers or chills or symptoms or not resolving with conservative treatment he states that he will come back and will participate with labs and imaging at that time.  However he declines labs and imaging today.  I will prescribe Zofran, Bentyl and loperamide for the patient.  Strict return precautions again discussed with the patient..  Patient is given ED precautions to return to the ED for any worsening or new symptoms. ? ? ? ?  ? ? ?FINAL CLINICAL IMPRESSION(S) / ED DIAGNOSES  ? ?Final diagnoses:  ?Abdominal cramping  ?Nausea vomiting and diarrhea  ? ? ? ?Rx / DC Orders  ? ?ED Discharge Orders   ? ?      Ordered  ?  ondansetron (ZOFRAN-ODT) 4 MG disintegrating tablet  Every 8 hours PRN       ? 01/27/22 2124  ?  loperamide (IMODIUM A-D) 2 MG tablet  4 times daily PRN       ? 01/27/22 2124  ?  dicyclomine (BENTYL) 10 MG capsule  3 times daily before meals & bedtime       ? 01/27/22 2124  ? ?  ?  ? ?  ? ? ? ?Note:  This document was prepared using Dragon voice recognition software and may include unintentional dictation errors. ?  ?Racheal Patches, PA-C ?01/27/22 2124 ? ?  ?Minna Antis, MD ?01/27/22 2315 ? ?

## 2022-01-27 NOTE — ED Triage Notes (Signed)
Pt got a coffee this morning and drank 80% before he realized the milk was bad. Pt started having abdominal pain and vomiting with diarrhea 3 hours ago. Pt now has weakness and a headache. Pt is alert and oriented in triage.  ?

## 2022-01-27 NOTE — ED Notes (Signed)
Pt refused labs in triage.  

## 2022-07-09 IMAGING — CT CT CERVICAL SPINE W/O CM
3 of 4 series · 12 of 33 positions shown, 14 images · non-contrast
Comparison: CT head dated April 25, 2014.

CLINICAL DATA: Head injury.

EXAM:
CT HEAD WITHOUT CONTRAST
CT CERVICAL SPINE WITHOUT CONTRAST
TECHNIQUE: Multidetector CT imaging of the head and cervical spine was
performed following the standard protocol without intravenous
contrast. Multiplanar CT image reconstructions of the cervical spine
were also generated.

[Series 4: sagittal bone · sagittal · 0.37mm/px · 5 of 108 slices shown, 6 images]
[im 36/108  bone]
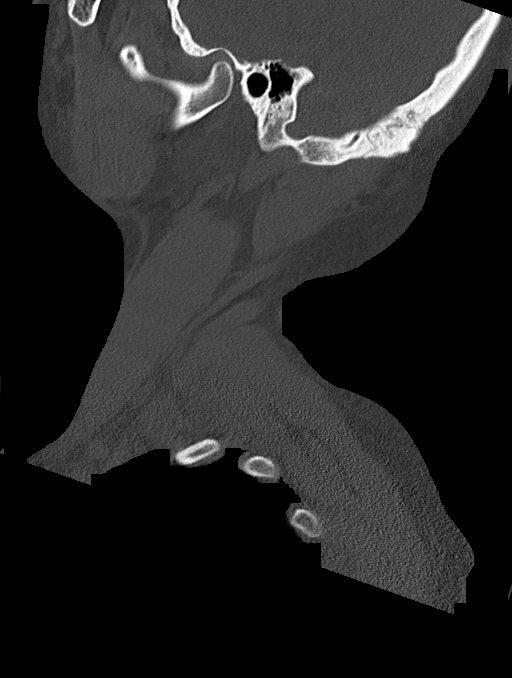
[im 45/108  bone]
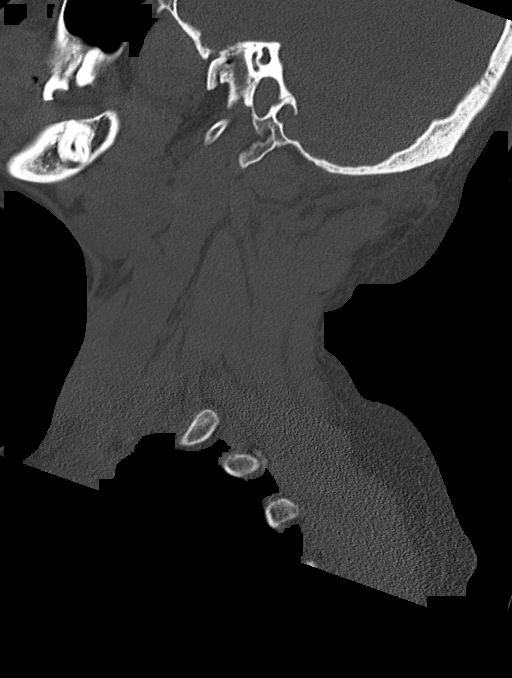
[im 54/108  soft-tissue]
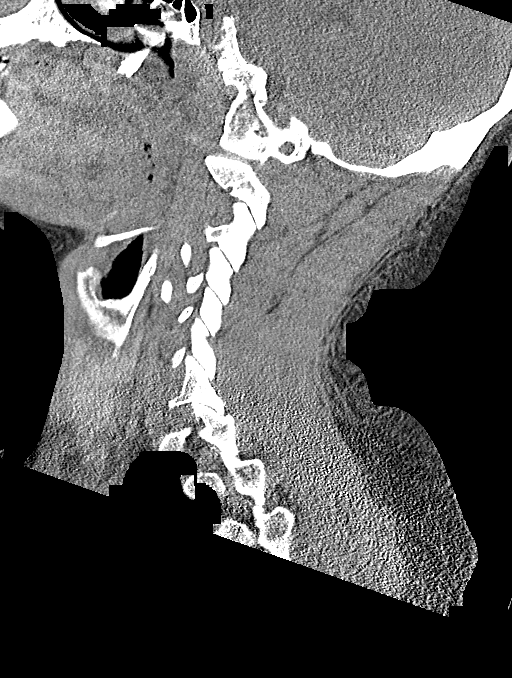
[im 54/108  bone]
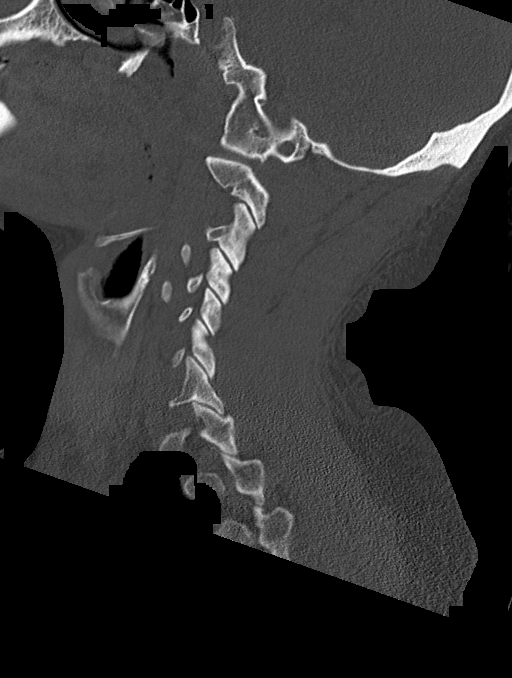
[im 63/108  bone]
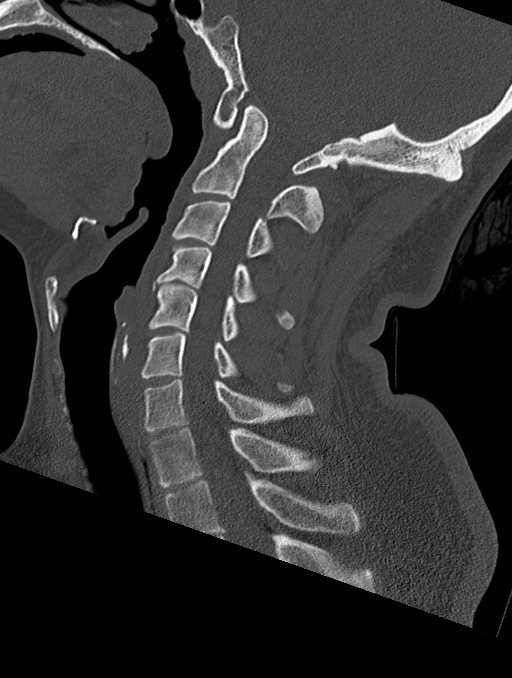
[im 72/108  bone]
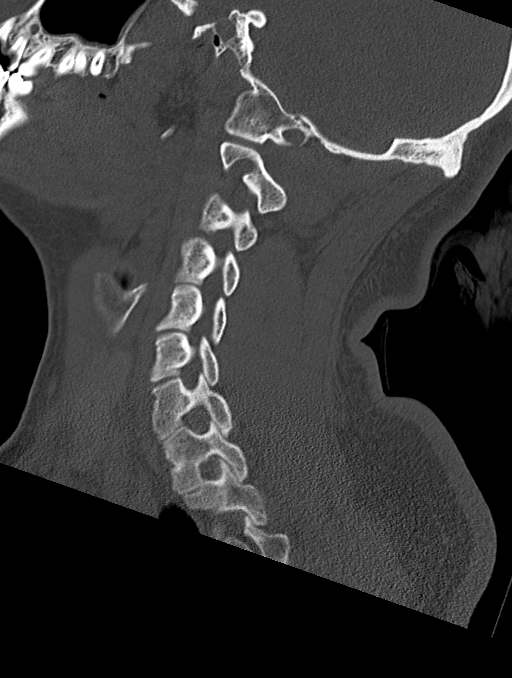

[Series 5: coronal bone · coronal · 0.42mm/px · 3 of 133 slices shown]
[im 27/133  bone]
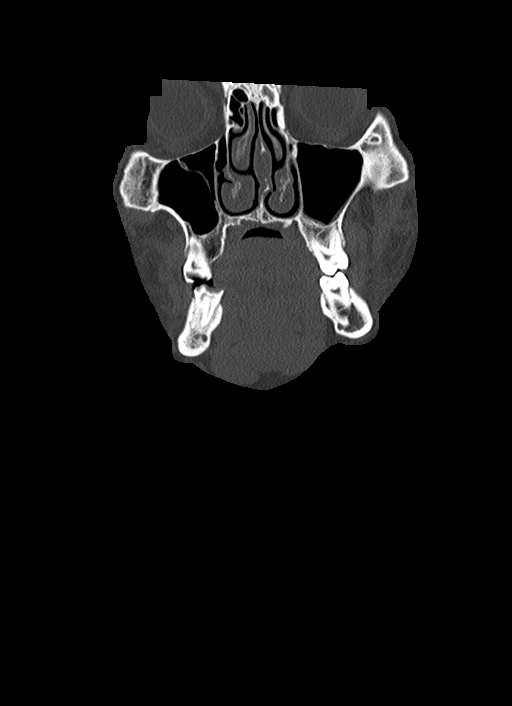
[im 53/133  bone]
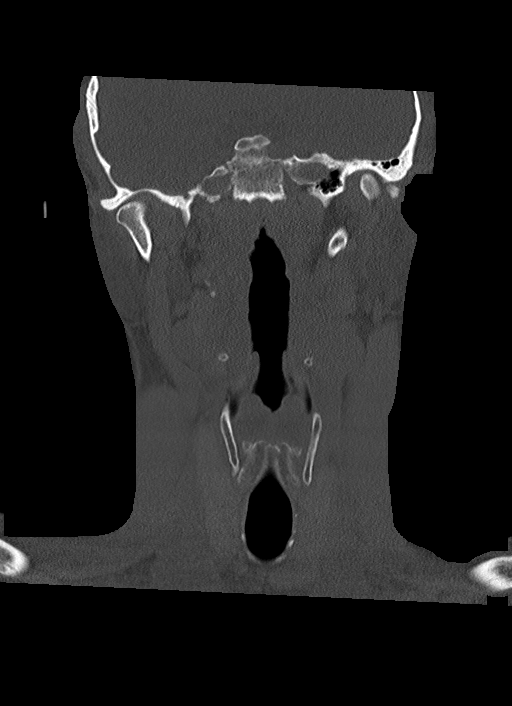
[im 80/133  bone]
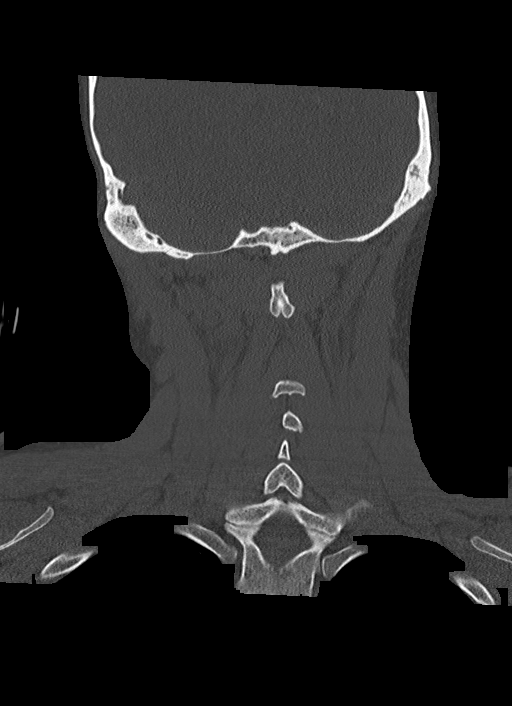

[Series 6: orthogonal bone · axial · 0.50mm/px · z∈[+20,+154]mm · 4 of 115 slices shown, 5 images]
[im 20/115  soft-tissue]
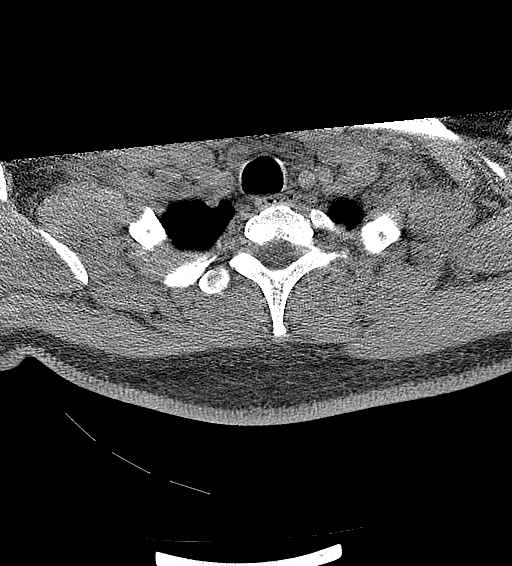
[im 20/115  bone]
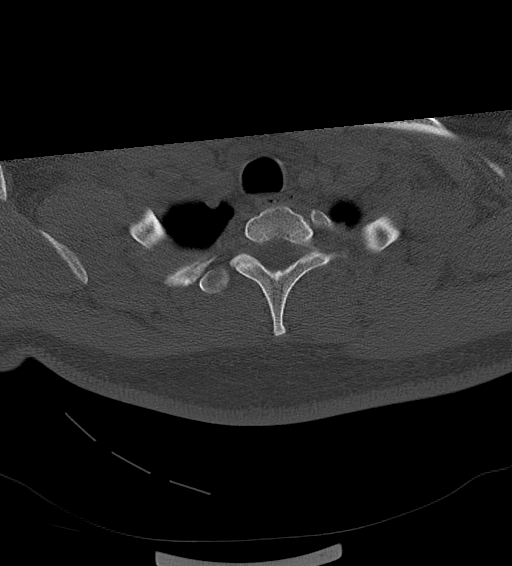
[im 39/115  bone]
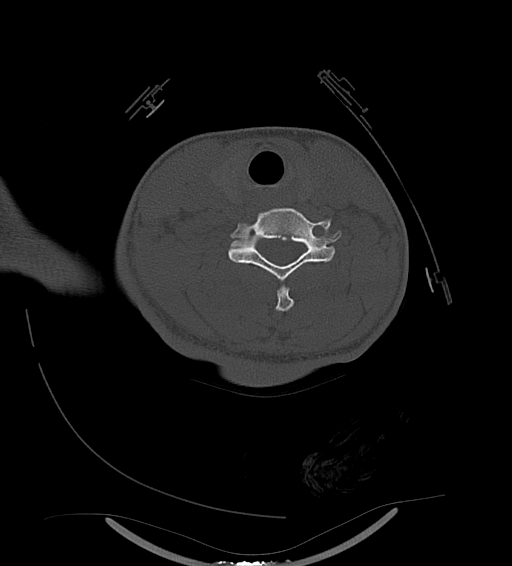
[im 77/115  bone]
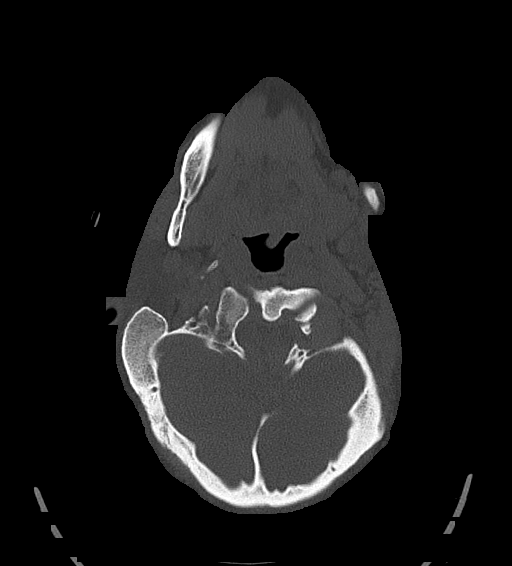
[im 96/115  bone]
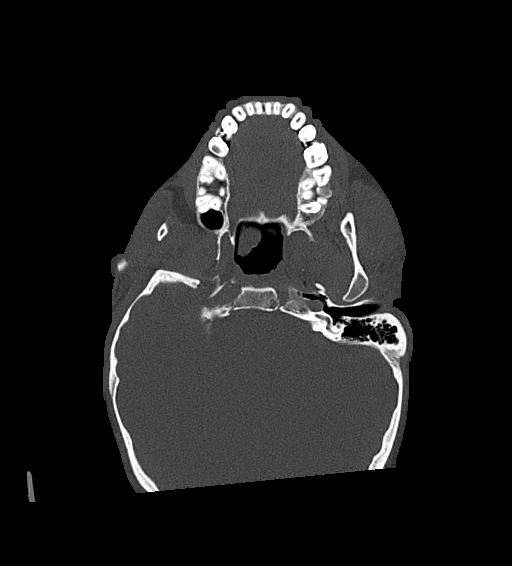

[12 of 33 positions shown; findings below may reference images not displayed]

FINDINGS: CT HEAD FINDINGS

Brain: No evidence of acute infarction, hemorrhage, hydrocephalus,
extra-axial collection or mass lesion/mass effect.

Vascular: No hyperdense vessel or unexpected calcification.

Skull: Negative for fracture or focal lesion. Postsurgical changes
of the frontal skull again noted.

Sinuses/Orbits: No acute finding.

Other: None.

CT CERVICAL SPINE FINDINGS

Alignment: Normal.

Skull base and vertebrae: No acute fracture. No primary bone lesion
or focal pathologic process.

Soft tissues and spinal canal: No prevertebral fluid or swelling. No
visible canal hematoma.

Disc levels:  Mild asymmetric right-sided disc height loss at C4-C5.

Upper chest: Negative.

Other: None.
IMPRESSION: 1. No acute intracranial abnormality.
2. No acute cervical spine fracture or traumatic listhesis.

## 2022-07-09 IMAGING — DX DG ELBOW COMPLETE 3+V*R*
4 series · 4 of 4 positions shown · non-contrast
Comparison: None.

CLINICAL DATA: Right elbow pain, trauma

EXAM:
RIGHT ELBOW - COMPLETE 3+ VIEW

[elbow ap]
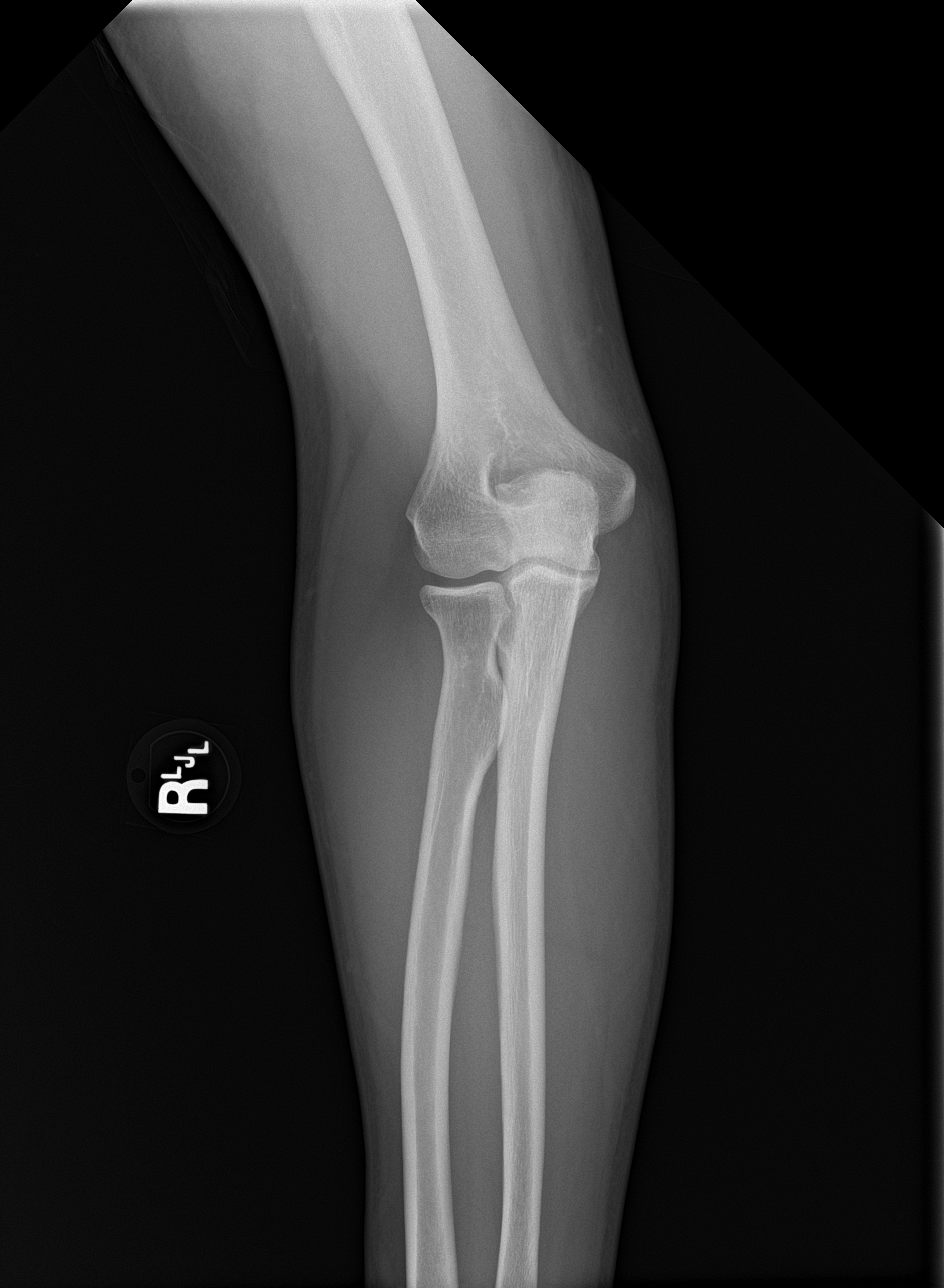

[elbow obl (1 of 2)]
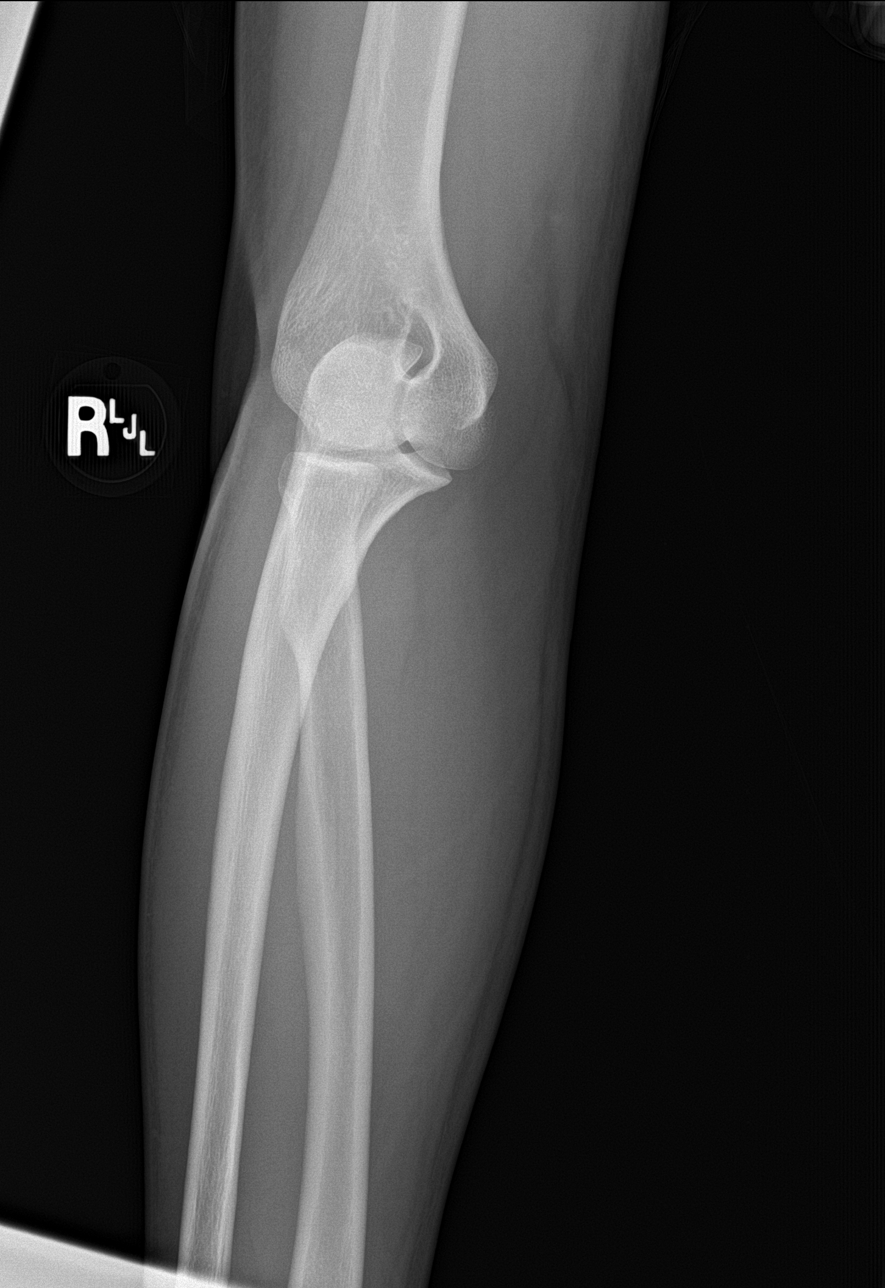

[elbow obl (2 of 2)]
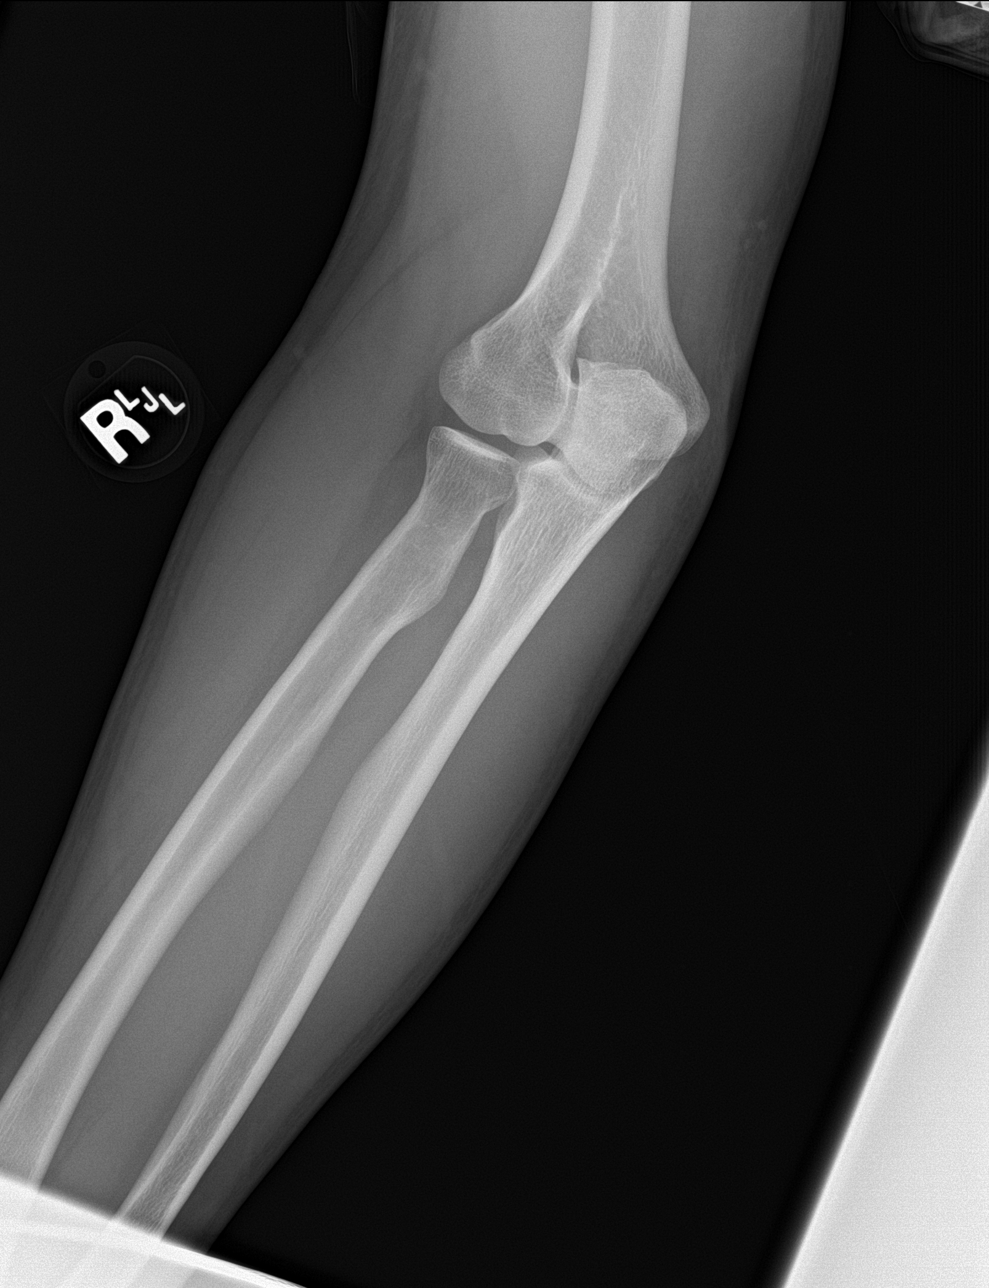

[elbow lat]
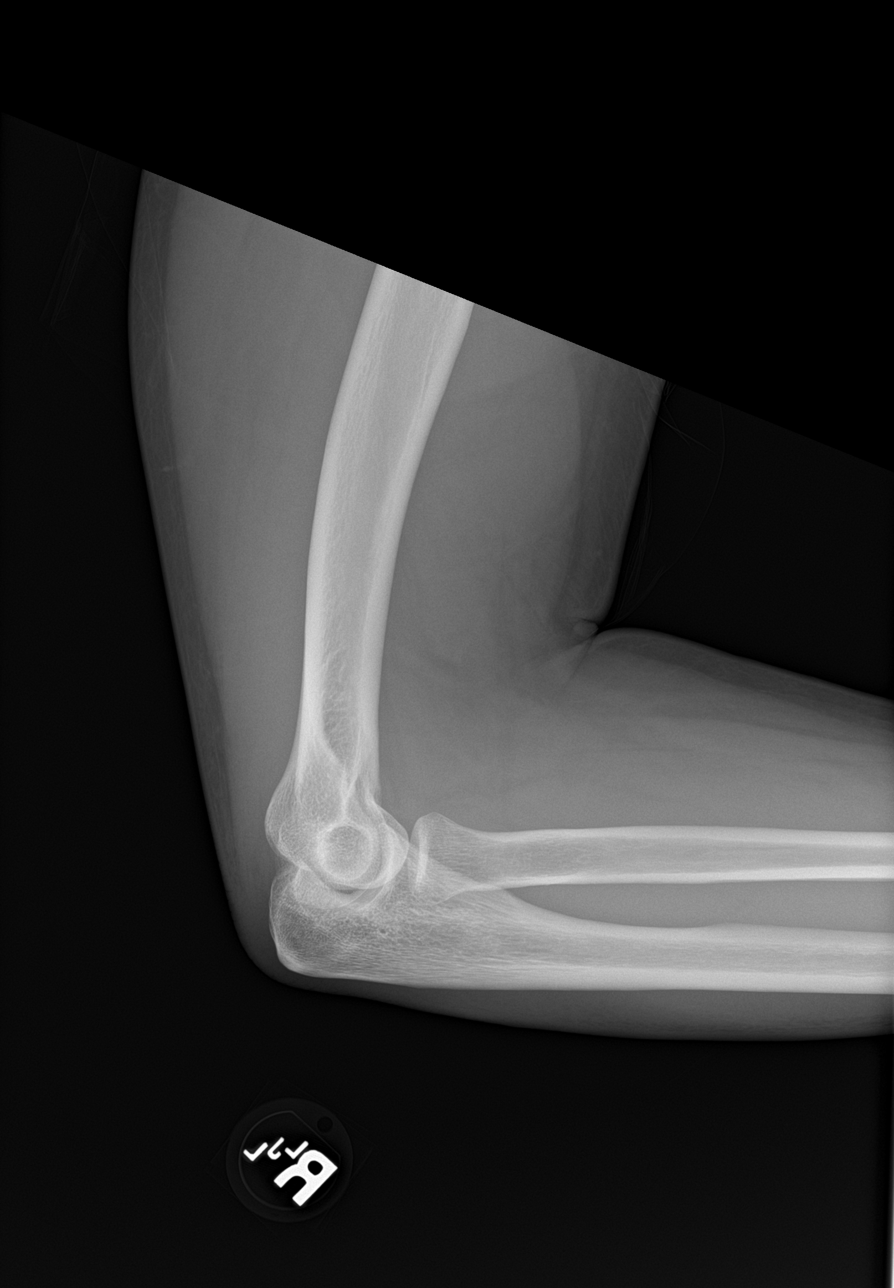

[4 of 4 positions shown; findings below may reference images not displayed]

FINDINGS: There is no evidence of fracture, dislocation, or joint effusion.
There is no evidence of arthropathy or other focal bone abnormality.
Soft tissues are unremarkable.
IMPRESSION: Negative.
